# Patient Record
Sex: Male | Born: 1965 | Race: Asian | Hispanic: No | Marital: Married | Smoking: Never smoker
Health system: Southern US, Community
[De-identification: ages and names within clinical notes are randomized; demographics above are authoritative.]

## PROBLEM LIST (undated history)

## (undated) DIAGNOSIS — K7030 Alcoholic cirrhosis of liver without ascites: Secondary | ICD-10-CM

## (undated) DIAGNOSIS — A159 Respiratory tuberculosis unspecified: Secondary | ICD-10-CM

## (undated) DIAGNOSIS — M109 Gout, unspecified: Secondary | ICD-10-CM

## (undated) HISTORY — PX: JOINT REPLACEMENT: SHX530

---

## 2005-05-11 DIAGNOSIS — A159 Respiratory tuberculosis unspecified: Secondary | ICD-10-CM

## 2005-05-11 HISTORY — DX: Respiratory tuberculosis unspecified: A15.9

## 2005-07-29 ENCOUNTER — Ambulatory Visit (HOSPITAL_COMMUNITY): Admission: RE | Admit: 2005-07-29 | Discharge: 2005-07-29 | Payer: Self-pay | Admitting: Pulmonary Disease

## 2006-02-01 ENCOUNTER — Ambulatory Visit (HOSPITAL_COMMUNITY): Admission: RE | Admit: 2006-02-01 | Discharge: 2006-02-01 | Payer: Self-pay | Admitting: Pulmonary Disease

## 2008-05-11 HISTORY — PX: TOTAL HIP ARTHROPLASTY: SHX124

## 2009-01-21 ENCOUNTER — Inpatient Hospital Stay (HOSPITAL_COMMUNITY): Admission: RE | Admit: 2009-01-21 | Discharge: 2009-01-23 | Payer: Self-pay | Admitting: Orthopaedic Surgery

## 2009-02-25 ENCOUNTER — Inpatient Hospital Stay (HOSPITAL_COMMUNITY): Admission: RE | Admit: 2009-02-25 | Discharge: 2009-02-27 | Payer: Self-pay | Admitting: Orthopaedic Surgery

## 2010-08-14 LAB — COMPREHENSIVE METABOLIC PANEL
AST: 38 U/L — ABNORMAL HIGH (ref 0–37)
Albumin: 4.6 g/dL (ref 3.5–5.2)
BUN: 5 mg/dL — ABNORMAL LOW (ref 6–23)
CO2: 25 mEq/L (ref 19–32)
Chloride: 101 mEq/L (ref 96–112)
GFR calc Af Amer: >60 mL/min (ref >60)
Glucose, Bld: 102 mg/dL — ABNORMAL HIGH (ref 70–99)
Potassium: 4.3 mEq/L (ref 3.5–5.1)
Sodium: 134 mEq/L — ABNORMAL LOW (ref 135–145)
Total Bilirubin: 1.3 mg/dL — ABNORMAL HIGH (ref 0.3–1.2)
Total Protein: 8.6 g/dL — ABNORMAL HIGH (ref 6.0–8.3)

## 2010-08-14 LAB — CBC
HCT: 24.1 % — ABNORMAL LOW (ref 39.0–52.0)
Hemoglobin: 11.6 g/dL — ABNORMAL LOW (ref 13.0–17.0)
MCHC: 34.2 g/dL (ref 30.0–36.0)
MCHC: 33.8 g/dL (ref 30.0–36.0)
MCV: 89.6 fL (ref 78.0–100.0)
Platelets: 155 10*3/uL (ref 150–400)
Platelets: 180 10*3/uL (ref 150–400)
RBC: 2.69 MIL/uL — ABNORMAL LOW (ref 4.22–5.81)
RDW: 14.2 % (ref 11.5–15.5)
RDW: 14.3 % (ref 11.5–15.5)
WBC: 9 10*3/uL (ref 4.0–10.5)

## 2010-08-14 LAB — PROTIME-INR
INR: 1.19 (ref 0.00–1.49)
Prothrombin Time: 15 seconds (ref 11.6–15.2)

## 2010-08-14 LAB — DIFFERENTIAL
Basophils Absolute: 0 10*3/uL (ref 0.0–0.1)
Basophils Relative: 0 % (ref 0–1)
Eosinophils Absolute: 0.1 10*3/uL (ref 0.0–0.7)
Lymphocytes Relative: 27 % (ref 12–46)
Lymphs Abs: 2.2 10*3/uL (ref 0.7–4.0)
Monocytes Absolute: 0.6 10*3/uL (ref 0.1–1.0)

## 2010-08-14 LAB — BASIC METABOLIC PANEL
CO2: 28 mEq/L (ref 19–32)
Calcium: 8.9 mg/dL (ref 8.4–10.5)
Chloride: 102 mEq/L (ref 96–112)
GFR calc Af Amer: >60 mL/min (ref >60)
Glucose, Bld: 119 mg/dL — ABNORMAL HIGH (ref 70–99)
Potassium: 4.1 mEq/L (ref 3.5–5.1)

## 2010-08-15 LAB — COMPREHENSIVE METABOLIC PANEL
AST: 25 U/L (ref 0–37)
CO2: 29 mEq/L (ref 19–32)
Calcium: 9.1 mg/dL (ref 8.4–10.5)
Creatinine, Ser: 0.64 mg/dL (ref 0.4–1.5)
GFR calc Af Amer: >60 mL/min (ref >60)
GFR calc non Af Amer: >60 mL/min (ref >60)
Sodium: 136 mEq/L (ref 135–145)
Total Protein: 6.3 g/dL (ref 6.0–8.3)

## 2010-08-15 LAB — BASIC METABOLIC PANEL
CO2: 26 mEq/L (ref 19–32)
Chloride: 97 mEq/L (ref 96–112)
Creatinine, Ser: 0.72 mg/dL (ref 0.4–1.5)
GFR calc Af Amer: >60 mL/min (ref >60)
GFR calc Af Amer: >60 mL/min (ref >60)
GFR calc non Af Amer: >60 mL/min (ref >60)

## 2010-08-15 LAB — CBC
HCT: 36.2 % — ABNORMAL LOW (ref 39.0–52.0)
Hemoglobin: 12.4 g/dL — ABNORMAL LOW (ref 13.0–17.0)
Hemoglobin: 9.1 g/dL — ABNORMAL LOW (ref 13.0–17.0)
MCHC: 34.6 g/dL (ref 30.0–36.0)
MCV: 87.6 fL (ref 78.0–100.0)
MCV: 88.8 fL (ref 78.0–100.0)
MCV: 89.2 fL (ref 78.0–100.0)
Platelets: 103 10*3/uL — ABNORMAL LOW (ref 150–400)
Platelets: 135 10*3/uL — ABNORMAL LOW (ref 150–400)
RDW: 13.1 % (ref 11.5–15.5)
RDW: 13.3 % (ref 11.5–15.5)
RDW: 13.4 % (ref 11.5–15.5)

## 2010-08-15 LAB — HEPATIC FUNCTION PANEL
ALT: 14 U/L (ref 0–53)
AST: 30 U/L (ref 0–37)
Albumin: 4 g/dL (ref 3.5–5.2)
Total Bilirubin: 1.5 mg/dL — ABNORMAL HIGH (ref 0.3–1.2)
Total Protein: 7.7 g/dL (ref 6.0–8.3)

## 2010-08-15 LAB — PROTIME-INR
Prothrombin Time: 15.3 seconds — ABNORMAL HIGH (ref 11.6–15.2)
Prothrombin Time: 16.4 seconds — ABNORMAL HIGH (ref 11.6–15.2)

## 2011-04-07 ENCOUNTER — Other Ambulatory Visit (HOSPITAL_COMMUNITY): Payer: Self-pay | Admitting: Orthopaedic Surgery

## 2011-04-07 DIAGNOSIS — Z96643 Presence of artificial hip joint, bilateral: Secondary | ICD-10-CM

## 2011-04-14 ENCOUNTER — Encounter (HOSPITAL_COMMUNITY): Payer: Self-pay

## 2011-04-14 ENCOUNTER — Ambulatory Visit (HOSPITAL_COMMUNITY): Payer: Self-pay

## 2012-12-21 ENCOUNTER — Other Ambulatory Visit: Payer: Self-pay | Admitting: Orthopaedic Surgery

## 2012-12-21 DIAGNOSIS — M25552 Pain in left hip: Secondary | ICD-10-CM

## 2012-12-22 ENCOUNTER — Other Ambulatory Visit: Payer: Self-pay

## 2012-12-26 ENCOUNTER — Ambulatory Visit
Admission: RE | Admit: 2012-12-26 | Discharge: 2012-12-26 | Disposition: A | Discharge status: Home / Self Care | Payer: BC Managed Care – PPO | Source: Ambulatory Visit | Attending: Orthopaedic Surgery | Admitting: Orthopaedic Surgery

## 2012-12-26 DIAGNOSIS — M25552 Pain in left hip: Secondary | ICD-10-CM

## 2013-04-03 ENCOUNTER — Other Ambulatory Visit (HOSPITAL_COMMUNITY): Payer: Self-pay | Admitting: Orthopaedic Surgery

## 2013-04-04 ENCOUNTER — Encounter (HOSPITAL_COMMUNITY): Payer: Self-pay | Admitting: Pharmacy Technician

## 2013-04-07 ENCOUNTER — Encounter (HOSPITAL_COMMUNITY)
Admission: RE | Admit: 2013-04-07 | Discharge: 2013-04-07 | Disposition: A | Discharge status: Home / Self Care | Payer: BC Managed Care – PPO | Source: Ambulatory Visit | Attending: Orthopaedic Surgery | Admitting: Orthopaedic Surgery

## 2013-04-07 ENCOUNTER — Encounter (HOSPITAL_COMMUNITY): Payer: Self-pay

## 2013-04-07 ENCOUNTER — Ambulatory Visit (HOSPITAL_COMMUNITY)
Admission: RE | Admit: 2013-04-07 | Discharge: 2013-04-07 | Disposition: A | Discharge status: Home / Self Care | Payer: BC Managed Care – PPO | Source: Ambulatory Visit | Attending: Orthopaedic Surgery | Admitting: Orthopaedic Surgery

## 2013-04-07 DIAGNOSIS — Z01818 Encounter for other preprocedural examination: Secondary | ICD-10-CM | POA: Insufficient documentation

## 2013-04-07 DIAGNOSIS — Z01810 Encounter for preprocedural cardiovascular examination: Secondary | ICD-10-CM | POA: Insufficient documentation

## 2013-04-07 DIAGNOSIS — Z01812 Encounter for preprocedural laboratory examination: Secondary | ICD-10-CM | POA: Insufficient documentation

## 2013-04-07 HISTORY — DX: Alcoholic cirrhosis of liver without ascites: K70.30

## 2013-04-07 HISTORY — DX: Respiratory tuberculosis unspecified: A15.9

## 2013-04-07 LAB — URINALYSIS, ROUTINE W REFLEX MICROSCOPIC
Nitrite: NEGATIVE
Protein, ur: NEGATIVE mg/dL
Urobilinogen, UA: 1 mg/dL (ref 0.0–1.0)

## 2013-04-07 LAB — CBC
HCT: 42.3 % (ref 39.0–52.0)
Hemoglobin: 14.1 g/dL (ref 13.0–17.0)
MCH: 30.7 pg (ref 26.0–34.0)
MCHC: 33.3 g/dL (ref 30.0–36.0)
RDW: 15.8 % — ABNORMAL HIGH (ref 11.5–15.5)

## 2013-04-07 LAB — COMPREHENSIVE METABOLIC PANEL
BUN: 5 mg/dL — ABNORMAL LOW (ref 6–23)
Calcium: 9.3 mg/dL (ref 8.4–10.5)
GFR calc Af Amer: >90 mL/min (ref >90)
Glucose, Bld: 103 mg/dL — ABNORMAL HIGH (ref 70–99)
Sodium: 134 mEq/L — ABNORMAL LOW (ref 135–145)
Total Protein: 7.8 g/dL (ref 6.0–8.3)

## 2013-04-07 LAB — TYPE AND SCREEN
ABO/RH(D): O POS
Antibody Screen: NEGATIVE

## 2013-04-07 LAB — PROTIME-INR: Prothrombin Time: 13.5 seconds (ref 11.6–15.2)

## 2013-04-07 LAB — URINE MICROSCOPIC-ADD ON

## 2013-04-07 LAB — SURGICAL PCR SCREEN: Staphylococcus aureus: NEGATIVE

## 2013-04-07 NOTE — Progress Notes (Signed)
Pt denies SOB, chest pain, and being under the care of a cardiologist. Pt denies having a chest x ray and EKG within the last year in addition to having any cardiac diagnostic studies. Pt chart forwarded to anesthesia for review of chest x ray and abnormal labs ( AST 84 with history of cirrhosis).

## 2013-04-07 NOTE — Pre-Procedure Instructions (Signed)
Jon Lopez  04/07/2013   Your procedure is scheduled on:  Friday, April 14, 2013  Report to [facility] Short Stay (use Main Entrance "A'') at 7:30 AM.  Call this number if you have problems the morning of surgery: [phone]   Remember:   Do not eat food or drink liquids after midnight.   Take these medicines the morning of surgery with A SIP OF WATER: NONE Stop taking Aspirin vitamins and herbal medications. Do not take any NSAIDs ie: Ibuprofen, Advil, Naproxen or any medication containing Aspirin.  Do not wear jewelry, make-up or nail polish.  Do not wear lotions, powders, or perfumes. You may wear deodorant.  Do not shave 48 hours prior to surgery. Men may shave face and neck.  Do not bring valuables to the hospital.  [facility] is not responsible for any belongings or valuables.               Contacts, dentures or bridgework may not be worn into surgery.  Leave suitcase in the car. After surgery it may be brought to your room.  For patients admitted to the hospital, discharge time is determined by your treatment team.               Patients discharged the day of surgery will not be allowed to drive home.  Name and phone number of your driver:  Special Instructions: Shower using CHG 2 nights before surgery and the night before surgery.  If you shower the day of surgery use CHG.  Use special wash - you have one bottle of CHG for all showers.  You should use approximately 1/3 of the bottle for each shower.   Please read over the following fact sheets that you were given: Pain Booklet, Coughing and Deep Breathing, Blood Transfusion Information, MRSA Information and Surgical Site Infection Prevention

## 2013-04-10 NOTE — Progress Notes (Addendum)
Anesthesia Chart Review:  Patient is a 47 year old male scheduled for revision of left THA on 04/14/13 by Dr. Ophelia Charter.  History includes non-smoker, alcoholic cirrhosis of the liver with still occasional ETOH intake, TB s/p treatment in 2007, right THA '10. PCP is listed as Dr. Doreen Beam. He has seen GI Dr. Karilyn Cota in [place] in the past, but > 3 years ago.  EKG on 04/07/13 showed NSR, right atrial enlargement, non-specific ST/T wave abnormality. Non-specific changes are new since 01/16/09.  CXR report on 04/07/13 showed: The lungs are adequately inflated. There is no focal infiltrate. Tiny densities within the right lung measuring 1-2 mm in diameter most compatible with the known previous granulomatous infection. These appear stable. The cardiac silhouette is normal in size. The mediastinum is normal in width. There is no pleural effusion or pneumothorax. The observed portions of the bony thorax exhibit no acute abnormalities. There is curvature of the thoracolumbar junction with the convexity towards the right which appears stable. IMPRESSION: There is no evidence of pneumonia nor CHF nor other acute cardiopulmonary abnormality. There are no findings to suggest reactivation of tuberculosis.  Preoperative labs noted.  AST is elevated at 84.  ALT normal at 42, total bilirubin elevated at 2.0.  PLT count 121K.  PT/PTT WNL. Currently, there are no recent comparison labs. (In 02/2009, total bilirubin was 1.3, AST 38, ALT 25, ALK PHOS 131.)  I spoke with patient via telephone. He has not seen Dr. Sherril Croon in > 2 years.  He denies chest pain, SOB, edema, known ascites.  He reports he previously tested negative for hepatitis B and C, but I do not have these records.  Chart reviewed with anesthesiologist Dr. Jean Rosenthal who recommended further discuss with Dr. Sherril Croon in terms of follow-up and potentially more specifics about his cirrhosis history.  Dr. Sherril Croon reports patient has not been seen in nearly three years, so further input  would really need to be in conjunction with a formal visit. He will will review results from patient's PAT visit on 03/07/13. I have notified Elnita Maxwell at Dr. Ophelia Charter' office.  I also spoke with Mr. Burak.  Patient will contact Dr. Sherril Croon about scheduling a preoperative visit this week to see if he is felt acceptable risk for this procedure and to discuss future cirrhosis follow-up.   [facility]/Anesthesiology Phone [phone] 04/10/2013 2:05 PM  Addendum: 04/11/2013 3:30 PM I received preoperative clearance note from Dr. Sherril Croon today.  He has cleared patient with moderate risk.  He discussed risk of increased risk of hepatotoxicity with anesthesia. He is treating patient's hypokalemia.  He did recommend post-operative LFTs. He also recommended a post-operative ammonia level if there was any changes in patient's mental status. He plans to refer patient back to gastroenterology for consideration of further testing following recovery from hip surgery. Cheryl at Dr. Ophelia Charter office updated as well. If no acute changes then I would anticipate that patient could proceed as planned. Definite anesthesia plan to be discussed after anesthesiologist evaluation on the day of surgery.

## 2013-04-12 NOTE — H&P (Signed)
TOTAL HIP REVISION ADMISSION H&P  Patient is admitted for left revision total hip arthroplasty.  Subjective:  Chief Complaint: left hip pain  HPI: Jon Lopez, 47 y.o. male, has a history of pain and functional disability in the left hip due to painful metal on metal total hip replacement. and patient has failed non-surgical conservative treatments for greater than 12 weeks to include NSAID's and/or analgesics and activity modification. The indications for the revision total hip arthroplasty are elevated metal ion levels including chromium and cobalt..  Onset of symptoms was gradual starting 4 years ago with gradually worsening course since that time.  Prior procedures on the left hip include total hip replacement using Depuy metal on metal components.  Patient currently rates pain in the left hip at 6 out of 10 with activity.  There is worsening of pain with activity and weight bearing and pain that interfers with activities of daily living. Patient has no evidence of loosening or resorption  by imaging studies.  This condition presents safety issues increasing the risk of falls.  This patient has had ultrasound showing no significant fluid collection of the hip.  he has elevated cobalt of 80.5 with normal being less than 1.59mcg/Liter  and chromium level of 17.5 with the normal being 1.2 mcg/Liter .  There is no current active infection.  There are no active problems to display for this patient.  Past Medical History  Diagnosis Date  . Tuberculosis   . Liver cirrhosis, alcoholic     Hx: of    Past Surgical History  Procedure Laterality Date  . Joint replacement      B/L hip replacement    No prescriptions prior to admission   No Known Allergies  History  Substance Use Topics  . Smoking status: Never Smoker   . Smokeless tobacco: Never Used  . Alcohol Use: Yes    No family history on file.    Review of Systems  Musculoskeletal:       Left hip pain  All other systems  reviewed and are negative.    Objective:  Physical Exam  Constitutional: He is oriented to person, place, and time. He appears well-developed and well-nourished.  HENT:  Head: Normocephalic and atraumatic.  Eyes: EOM are normal. Pupils are equal, round, and reactive to light.  Neck: Normal range of motion.  Cardiovascular: Normal rate and intact distal pulses.   Respiratory: Effort normal.  GI: Soft.  Musculoskeletal:  Well healed surgical scar left hip.  No edema or erythema around hip or buttock.  Painful ROM left hip. Leg lengths are equal. Audible and palpable clunk when he walks and extends hip   Neurological: He is alert and oriented to person, place, and time.  Skin: Skin is warm and dry.  Psychiatric: He has a normal mood and affect.    Vital signs in last 24 hours:     Labs:   There is no height or weight on file to calculate BMI.  Imaging Review:  Plain radiographs demonstrates no loosening or resorption of the left hip(s). The bone quality appears to be adequate for age and reported activity level. There is no heterotopic ossification of the left hip.  Assessment/Plan:  Painful  left hip(s) with failed previous metal on metal Depuy arthroplasty. Elevated Chromium and cobalt ion levels.   Revision total hip arthroplasty is deemed medically necessary. The treatment options including medical management, injection therapy, arthroscopy and arthroplasty were discussed at length. The risks and benefits of  total hip arthroplasty were presented and reviewed. The risks due to aseptic loosening, infection, stiffness, dislocation/subluxation,  thromboembolic complications and other imponderables were discussed.  The patient acknowledged the explanation, agreed to proceed with the plan and consent was signed. Patient is being admitted for inpatient treatment for surgery, pain control, PT, OT, prophylactic antibiotics, VTE prophylaxis, progressive ambulation and ADL's and  discharge planning. The patient is planning to be discharged home with home health services

## 2013-04-13 MED ORDER — CEFAZOLIN SODIUM-DEXTROSE 2-3 GM-% IV SOLR
2.0000 g | INTRAVENOUS | Status: AC
  Administered 2013-04-14: 2 g via INTRAVENOUS
  Filled 2013-04-13: qty 50

## 2013-04-14 ENCOUNTER — Inpatient Hospital Stay (HOSPITAL_COMMUNITY)
Admission: RE | Admit: 2013-04-14 | Discharge: 2013-04-16 | DRG: 468 | Disposition: A | Discharge status: Home Health | Payer: BC Managed Care – PPO | Source: Ambulatory Visit | Attending: Orthopaedic Surgery | Admitting: Orthopaedic Surgery

## 2013-04-14 ENCOUNTER — Inpatient Hospital Stay (HOSPITAL_COMMUNITY): Payer: BC Managed Care – PPO | Admitting: Certified Registered"

## 2013-04-14 ENCOUNTER — Inpatient Hospital Stay (HOSPITAL_COMMUNITY): Payer: BC Managed Care – PPO

## 2013-04-14 ENCOUNTER — Encounter (HOSPITAL_COMMUNITY): Payer: Self-pay | Admitting: *Deleted

## 2013-04-14 ENCOUNTER — Encounter (HOSPITAL_COMMUNITY): Payer: BC Managed Care – PPO | Admitting: Vascular Surgery

## 2013-04-14 ENCOUNTER — Encounter (HOSPITAL_COMMUNITY)
Admission: RE | Disposition: A | Discharge status: Home Health | Payer: Self-pay | Source: Ambulatory Visit | Attending: Orthopaedic Surgery

## 2013-04-14 DIAGNOSIS — K7030 Alcoholic cirrhosis of liver without ascites: Secondary | ICD-10-CM | POA: Diagnosis present

## 2013-04-14 DIAGNOSIS — T8484XA Pain due to internal orthopedic prosthetic devices, implants and grafts, initial encounter: Secondary | ICD-10-CM

## 2013-04-14 DIAGNOSIS — F1020 Alcohol dependence, uncomplicated: Secondary | ICD-10-CM | POA: Diagnosis present

## 2013-04-14 DIAGNOSIS — Y831 Surgical operation with implant of artificial internal device as the cause of abnormal reaction of the patient, or of later complication, without mention of misadventure at the time of the procedure: Secondary | ICD-10-CM | POA: Diagnosis present

## 2013-04-14 DIAGNOSIS — Z8611 Personal history of tuberculosis: Secondary | ICD-10-CM

## 2013-04-14 DIAGNOSIS — Z96649 Presence of unspecified artificial hip joint: Secondary | ICD-10-CM

## 2013-04-14 DIAGNOSIS — Y92009 Unspecified place in unspecified non-institutional (private) residence as the place of occurrence of the external cause: Secondary | ICD-10-CM

## 2013-04-14 DIAGNOSIS — T8489XA Other specified complication of internal orthopedic prosthetic devices, implants and grafts, initial encounter: Principal | ICD-10-CM | POA: Diagnosis present

## 2013-04-14 DIAGNOSIS — Z23 Encounter for immunization: Secondary | ICD-10-CM

## 2013-04-14 HISTORY — PX: REVISION TOTAL HIP ARTHROPLASTY: SHX766

## 2013-04-14 HISTORY — DX: Gout, unspecified: M10.9

## 2013-04-14 HISTORY — PX: TOTAL HIP REVISION: SHX763

## 2013-04-14 SURGERY — TOTAL HIP REVISION
Anesthesia: General | Site: Hip | Laterality: Left

## 2013-04-14 MED ORDER — KETOROLAC TROMETHAMINE 30 MG/ML IJ SOLN
INTRAMUSCULAR | Status: AC
  Administered 2013-04-14: 15 mg
  Filled 2013-04-14: qty 1

## 2013-04-14 MED ORDER — PHENOL 1.4 % MT LIQD: 1.0000 | OROMUCOSAL | Status: DC | PRN

## 2013-04-14 MED ORDER — 0.9 % SODIUM CHLORIDE (POUR BTL) OPTIME
TOPICAL | Status: DC | PRN
  Administered 2013-04-14: 1000 mL

## 2013-04-14 MED ORDER — BUPIVACAINE HCL (PF) 0.25 % IJ SOLN
INTRAMUSCULAR | Status: AC
  Filled 2013-04-14: qty 30

## 2013-04-14 MED ORDER — PROMETHAZINE HCL 25 MG/ML IJ SOLN: 6.2500 mg | INTRAMUSCULAR | Status: DC | PRN

## 2013-04-14 MED ORDER — ROCURONIUM BROMIDE 100 MG/10ML IV SOLN
INTRAVENOUS | Status: DC | PRN
  Administered 2013-04-14: 50 mg via INTRAVENOUS

## 2013-04-14 MED ORDER — ASPIRIN EC 325 MG PO TBEC: 325.0000 mg | DELAYED_RELEASE_TABLET | Freq: Every day | ORAL | Status: AC

## 2013-04-14 MED ORDER — IBUPROFEN 800 MG PO TABS
800.0000 mg | ORAL_TABLET | Freq: Four times a day (QID) | ORAL | Status: DC | PRN
  Filled 2013-04-14: qty 1

## 2013-04-14 MED ORDER — OXYCODONE HCL 5 MG PO TABS
ORAL_TABLET | ORAL | Status: AC
  Filled 2013-04-14: qty 1

## 2013-04-14 MED ORDER — METHOCARBAMOL 500 MG PO TABS: 500.0000 mg | ORAL_TABLET | Freq: Four times a day (QID) | ORAL | Status: AC | PRN

## 2013-04-14 MED ORDER — INFLUENZA VAC SPLIT QUAD 0.5 ML IM SUSP
0.5000 mL | INTRAMUSCULAR | Status: AC
  Administered 2013-04-16: 0.5 mL via INTRAMUSCULAR
  Filled 2013-04-14: qty 0.5

## 2013-04-14 MED ORDER — OXYCODONE HCL 5 MG PO TABS
5.0000 mg | ORAL_TABLET | ORAL | Status: DC | PRN
  Administered 2013-04-14 – 2013-04-15 (×2): 10 mg via ORAL
  Administered 2013-04-15: 5 mg via ORAL
  Administered 2013-04-15 – 2013-04-16 (×7): 10 mg via ORAL
  Filled 2013-04-14 (×10): qty 2

## 2013-04-14 MED ORDER — OXYCODONE HCL 5 MG PO TABS
5.0000 mg | ORAL_TABLET | Freq: Once | ORAL | Status: AC | PRN
  Administered 2013-04-14: 5 mg via ORAL

## 2013-04-14 MED ORDER — ASPIRIN EC 325 MG PO TBEC
325.0000 mg | DELAYED_RELEASE_TABLET | Freq: Every day | ORAL | Status: DC
  Administered 2013-04-15 – 2013-04-16 (×2): 325 mg via ORAL
  Filled 2013-04-14 (×3): qty 1

## 2013-04-14 MED ORDER — BUPIVACAINE HCL (PF) 0.25 % IJ SOLN
INTRAMUSCULAR | Status: DC | PRN
  Administered 2013-04-14: 20 mL

## 2013-04-14 MED ORDER — OXYCODONE-ACETAMINOPHEN 5-325 MG PO TABS: 1.0000 | ORAL_TABLET | ORAL | Status: AC | PRN

## 2013-04-14 MED ORDER — KETOROLAC TROMETHAMINE 15 MG/ML IJ SOLN: 15.0000 mg | Freq: Once | INTRAMUSCULAR | Status: AC

## 2013-04-14 MED ORDER — MORPHINE SULFATE 2 MG/ML IJ SOLN
2.0000 mg | INTRAMUSCULAR | Status: DC | PRN
  Administered 2013-04-14 – 2013-04-15 (×3): 2 mg via INTRAVENOUS
  Filled 2013-04-14 (×3): qty 1

## 2013-04-14 MED ORDER — GLYCOPYRROLATE 0.2 MG/ML IJ SOLN
INTRAMUSCULAR | Status: DC | PRN
  Administered 2013-04-14: 0.4 mg via INTRAVENOUS

## 2013-04-14 MED ORDER — ONDANSETRON HCL 4 MG/2ML IJ SOLN
INTRAMUSCULAR | Status: DC | PRN
  Administered 2013-04-14: 4 mg via INTRAVENOUS

## 2013-04-14 MED ORDER — ONDANSETRON HCL 4 MG PO TABS: 4.0000 mg | ORAL_TABLET | Freq: Four times a day (QID) | ORAL | Status: DC | PRN

## 2013-04-14 MED ORDER — MENTHOL 3 MG MT LOZG
1.0000 | LOZENGE | OROMUCOSAL | Status: DC | PRN
  Filled 2013-04-14: qty 9

## 2013-04-14 MED ORDER — HYDROMORPHONE HCL PF 1 MG/ML IJ SOLN
INTRAMUSCULAR | Status: AC
  Administered 2013-04-14: 0.5 mg via INTRAVENOUS
  Filled 2013-04-14: qty 1

## 2013-04-14 MED ORDER — LACTATED RINGERS IV SOLN
Freq: Once | INTRAVENOUS | Status: AC
  Administered 2013-04-14 (×2): via INTRAVENOUS

## 2013-04-14 MED ORDER — KCL IN DEXTROSE-NACL 20-5-0.45 MEQ/L-%-% IV SOLN
INTRAVENOUS | Status: DC
  Administered 2013-04-14: 16:00:00 via INTRAVENOUS
  Administered 2013-04-15: 30 mL/h via INTRAVENOUS
  Filled 2013-04-14 (×5): qty 1000

## 2013-04-14 MED ORDER — ACETAMINOPHEN 650 MG RE SUPP: 650.0000 mg | Freq: Four times a day (QID) | RECTAL | Status: DC | PRN

## 2013-04-14 MED ORDER — ACETAMINOPHEN 325 MG PO TABS
650.0000 mg | ORAL_TABLET | Freq: Four times a day (QID) | ORAL | Status: DC | PRN
  Administered 2013-04-15: 650 mg via ORAL
  Filled 2013-04-14: qty 2

## 2013-04-14 MED ORDER — METOCLOPRAMIDE HCL 5 MG/ML IJ SOLN: 5.0000 mg | Freq: Three times a day (TID) | INTRAMUSCULAR | Status: DC | PRN

## 2013-04-14 MED ORDER — PNEUMOCOCCAL VAC POLYVALENT 25 MCG/0.5ML IJ INJ
0.5000 mL | INJECTION | INTRAMUSCULAR | Status: AC
  Administered 2013-04-16: 0.5 mL via INTRAMUSCULAR
  Filled 2013-04-14: qty 0.5

## 2013-04-14 MED ORDER — OXYCODONE HCL 5 MG/5ML PO SOLN: 5.0000 mg | Freq: Once | ORAL | Status: AC | PRN

## 2013-04-14 MED ORDER — PHENYLEPHRINE HCL 10 MG/ML IJ SOLN
INTRAMUSCULAR | Status: DC | PRN
  Administered 2013-04-14 (×3): 80 ug via INTRAVENOUS

## 2013-04-14 MED ORDER — FENTANYL CITRATE 0.05 MG/ML IJ SOLN
INTRAMUSCULAR | Status: DC | PRN
  Administered 2013-04-14: 50 ug via INTRAVENOUS
  Administered 2013-04-14 (×2): 25 ug via INTRAVENOUS
  Administered 2013-04-14: 150 ug via INTRAVENOUS

## 2013-04-14 MED ORDER — MIDAZOLAM HCL 5 MG/5ML IJ SOLN
INTRAMUSCULAR | Status: DC | PRN
  Administered 2013-04-14: 2 mg via INTRAVENOUS

## 2013-04-14 MED ORDER — DIPHENHYDRAMINE HCL 25 MG PO CAPS
25.0000 mg | ORAL_CAPSULE | Freq: Four times a day (QID) | ORAL | Status: DC | PRN
  Administered 2013-04-14 – 2013-04-15 (×3): 25 mg via ORAL
  Filled 2013-04-14 (×3): qty 1

## 2013-04-14 MED ORDER — HYDROMORPHONE HCL PF 1 MG/ML IJ SOLN
INTRAMUSCULAR | Status: AC
  Filled 2013-04-14: qty 1

## 2013-04-14 MED ORDER — HYDROMORPHONE HCL PF 1 MG/ML IJ SOLN
0.2500 mg | INTRAMUSCULAR | Status: DC | PRN
  Administered 2013-04-14 (×4): 0.5 mg via INTRAVENOUS

## 2013-04-14 MED ORDER — NEOSTIGMINE METHYLSULFATE 1 MG/ML IJ SOLN
INTRAMUSCULAR | Status: DC | PRN
  Administered 2013-04-14: 3 mg via INTRAVENOUS

## 2013-04-14 MED ORDER — LIDOCAINE HCL (CARDIAC) 20 MG/ML IV SOLN
INTRAVENOUS | Status: DC | PRN
  Administered 2013-04-14: 75 mg via INTRAVENOUS

## 2013-04-14 MED ORDER — PROPOFOL 10 MG/ML IV BOLUS
INTRAVENOUS | Status: DC | PRN
  Administered 2013-04-14: 180 mg via INTRAVENOUS

## 2013-04-14 MED ORDER — CHLORHEXIDINE GLUCONATE 4 % EX LIQD: 60.0000 mL | Freq: Once | CUTANEOUS | Status: DC

## 2013-04-14 MED ORDER — ONDANSETRON HCL 4 MG/2ML IJ SOLN: 4.0000 mg | Freq: Four times a day (QID) | INTRAMUSCULAR | Status: DC | PRN

## 2013-04-14 MED ORDER — METOCLOPRAMIDE HCL 10 MG PO TABS: 5.0000 mg | ORAL_TABLET | Freq: Three times a day (TID) | ORAL | Status: DC | PRN

## 2013-04-14 SURGICAL SUPPLY — 68 items
APL SKNCLS STERI-STRIP NONHPOA (GAUZE/BANDAGES/DRESSINGS) ×1
ARTICULEZE HEAD (Hips) ×2 IMPLANT
BENZOIN TINCTURE PRP APPL 2/3 (GAUZE/BANDAGES/DRESSINGS) ×2 IMPLANT
BLADE SURG 10 STRL SS (BLADE) ×3 IMPLANT
BLADE SURG ROTATE 9660 (MISCELLANEOUS) IMPLANT
BRUSH FEMORAL CANAL (MISCELLANEOUS) IMPLANT
CLOTH BEACON ORANGE TIMEOUT ST (SAFETY) ×2 IMPLANT
COVER BACK TABLE 24X17X13 BIG (DRAPES) ×2 IMPLANT
COVER SURGICAL LIGHT HANDLE (MISCELLANEOUS) ×2 IMPLANT
DRAPE C-ARM 42X72 X-RAY (DRAPES) IMPLANT
DRAPE INCISE IOBAN 66X45 STRL (DRAPES) IMPLANT
DRAPE ORTHO SPLIT 77X108 STRL (DRAPES) ×4
DRAPE SURG ORHT 6 SPLT 77X108 (DRAPES) ×2 IMPLANT
DRAPE U-SHAPE 47X51 STRL (DRAPES) ×2 IMPLANT
DRSG PAD ABDOMINAL 8X10 ST (GAUZE/BANDAGES/DRESSINGS) ×4 IMPLANT
DURAPREP 26ML APPLICATOR (WOUND CARE) ×2 IMPLANT
ELECT BLADE 4.0 EZ CLEAN MEGAD (MISCELLANEOUS) ×2
ELECT CAUTERY BLADE 6.4 (BLADE) ×2 IMPLANT
ELECT REM PT RETURN 9FT ADLT (ELECTROSURGICAL) ×2
ELECTRODE BLDE 4.0 EZ CLN MEGD (MISCELLANEOUS) IMPLANT
ELECTRODE REM PT RTRN 9FT ADLT (ELECTROSURGICAL) ×1 IMPLANT
EVACUATOR 1/8 PVC DRAIN (DRAIN) IMPLANT
FACESHIELD LNG OPTICON STERILE (SAFETY) ×4 IMPLANT
GAUZE XEROFORM 5X9 LF (GAUZE/BANDAGES/DRESSINGS) ×2 IMPLANT
GLOVE BIOGEL PI IND STRL 7.5 (GLOVE) ×1 IMPLANT
GLOVE BIOGEL PI IND STRL 8 (GLOVE) ×1 IMPLANT
GLOVE BIOGEL PI INDICATOR 7.5 (GLOVE) ×1
GLOVE BIOGEL PI INDICATOR 8 (GLOVE) ×1
GLOVE ECLIPSE 7.0 STRL STRAW (GLOVE) ×2 IMPLANT
GLOVE ORTHO TXT STRL SZ7.5 (GLOVE) ×2 IMPLANT
GOWN PREVENTION PLUS LG XLONG (DISPOSABLE) IMPLANT
GOWN PREVENTION PLUS XLARGE (GOWN DISPOSABLE) ×2 IMPLANT
GOWN STRL NON-REIN LRG LVL3 (GOWN DISPOSABLE) ×2 IMPLANT
HANDPIECE INTERPULSE COAX TIP (DISPOSABLE)
HEAD ARTICULEZE (Hips) IMPLANT
IMMOBILIZER KNEE 20 (SOFTGOODS)
IMMOBILIZER KNEE 20 THIGH 36 (SOFTGOODS) IMPLANT
IMMOBILIZER KNEE 22 UNIV (SOFTGOODS) IMPLANT
IMMOBILIZER KNEE 24 THIGH 36 (MISCELLANEOUS) IMPLANT
IMMOBILIZER KNEE 24 UNIV (MISCELLANEOUS)
KIT BASIN OR (CUSTOM PROCEDURE TRAY) ×2 IMPLANT
KIT ROOM TURNOVER OR (KITS) ×2 IMPLANT
LINER MARATHON 4MM 10DEG 36X52 (Hips) ×1 IMPLANT
MANIFOLD NEPTUNE II (INSTRUMENTS) ×2 IMPLANT
NDL 1/2 CIR MAYO (NEEDLE) ×1 IMPLANT
NDL HYPO 25GX1X1/2 BEV (NEEDLE) ×1 IMPLANT
NEEDLE 1/2 CIR MAYO (NEEDLE) ×2 IMPLANT
NEEDLE HYPO 25GX1X1/2 BEV (NEEDLE) ×2 IMPLANT
NS IRRIG 1000ML POUR BTL (IV SOLUTION) ×2 IMPLANT
PACK TOTAL JOINT (CUSTOM PROCEDURE TRAY) ×2 IMPLANT
PAD ARMBOARD 7.5X6 YLW CONV (MISCELLANEOUS) ×4 IMPLANT
REAMER ROD DEEP FLUTE 2.5X950 (INSTRUMENTS) IMPLANT
SET HNDPC FAN SPRY TIP SCT (DISPOSABLE) IMPLANT
SPONGE GAUZE 4X4 12PLY (GAUZE/BANDAGES/DRESSINGS) ×2 IMPLANT
SPONGE LAP 4X18 X RAY DECT (DISPOSABLE) ×4 IMPLANT
STAPLER VISISTAT 35W (STAPLE) ×2 IMPLANT
SUCTION FRAZIER TIP 10 FR DISP (SUCTIONS) ×2 IMPLANT
SUT ETHIBOND NAB CT1 #1 30IN (SUTURE) ×6 IMPLANT
SUT TICRON (SUTURE) ×4 IMPLANT
SUT VIC AB 2-0 CT1 27 (SUTURE) ×6
SUT VIC AB 2-0 CT1 TAPERPNT 27 (SUTURE) ×3 IMPLANT
SUT VICRYL 0 TIES 12 18 (SUTURE) ×2 IMPLANT
SYR CONTROL 10ML LL (SYRINGE) ×2 IMPLANT
TOWEL OR 17X24 6PK STRL BLUE (TOWEL DISPOSABLE) ×2 IMPLANT
TOWEL OR 17X26 10 PK STRL BLUE (TOWEL DISPOSABLE) ×2 IMPLANT
TOWER CARTRIDGE SMART MIX (DISPOSABLE) IMPLANT
TRAY FOLEY CATH 16FRSI W/METER (SET/KITS/TRAYS/PACK) IMPLANT
WATER STERILE IRR 1000ML POUR (IV SOLUTION) ×8 IMPLANT

## 2013-04-14 NOTE — Preoperative (Signed)
Beta Blockers   Reason not to administer Beta Blockers:Not Applicable 

## 2013-04-14 NOTE — Transfer of Care (Signed)
Immediate Anesthesia Transfer of Care Note  Patient: Jon Lopez  Procedure(s) Performed: Procedure(s) with comments: TOTAL HIP REVISION (Left) - Revision Left Total Hip Arthroplasty  Patient Location: PACU  Anesthesia Type:General  Level of Consciousness: awake, alert  and oriented  Airway & Oxygen Therapy: Patient Spontanous Breathing and Patient connected to nasal cannula oxygen  Post-op Assessment: Report given to PACU RN  Post vital signs: Reviewed and stable  Complications: No apparent anesthesia complications

## 2013-04-14 NOTE — Anesthesia Postprocedure Evaluation (Signed)
Anesthesia Post Note  Patient: Jon Lopez  Procedure(s) Performed: Procedure(s) (LRB): TOTAL HIP REVISION (Left)  Anesthesia type: general  Patient location: PACU  Post pain: Pain level controlled  Post assessment: Patient's Cardiovascular Status Stable  Last Vitals:  Filed Vitals:   04/14/13 1300  BP: 108/81  Pulse: 74  Temp:   Resp: 9    Post vital signs: Reviewed and stable  Level of consciousness: sedated  Complications: No apparent anesthesia complications

## 2013-04-14 NOTE — Anesthesia Preprocedure Evaluation (Signed)
Anesthesia Evaluation  Patient identified by MRN, date of birth, ID band Patient awake    Reviewed: Allergy & Precautions, H&P , NPO status , Patient's Chart, lab work & pertinent test results  History of Anesthesia Complications Negative for: history of anesthetic complications  Airway Mallampati: II TM Distance: >3 FB Neck ROM: Full    Dental  (+) Teeth Intact and Dental Advisory Given   Pulmonary neg pulmonary ROS,    Pulmonary exam normal       Cardiovascular negative cardio ROS      Neuro/Psych negative neurological ROS  negative psych ROS   GI/Hepatic negative GI ROS, (+) Cirrhosis -       ,   Endo/Other  negative endocrine ROS  Renal/GU negative Renal ROS     Musculoskeletal   Abdominal   Peds  Hematology negative hematology ROS (+)   Anesthesia Other Findings   Reproductive/Obstetrics                           Anesthesia Physical Anesthesia Plan  ASA: III  Anesthesia Plan: General   Post-op Pain Management:    Induction: Intravenous  Airway Management Planned: Oral ETT  Additional Equipment:   Intra-op Plan:   Post-operative Plan: Extubation in OR  Informed Consent: I have reviewed the patients History and Physical, chart, labs and discussed the procedure including the risks, benefits and alternatives for the proposed anesthesia with the patient or authorized representative who has indicated his/her understanding and acceptance.   Dental advisory given  Plan Discussed with: CRNA, Anesthesiologist and Surgeon  Anesthesia Plan Comments:         Anesthesia Quick Evaluation

## 2013-04-14 NOTE — Interval H&P Note (Signed)
History and Physical Interval Note:  04/14/2013 8:59 AM  Jon Lopez  has presented today for surgery, with the diagnosis of Painful Left Hip Metal on Metal Total Hip Arthroplasty  The various methods of treatment have been discussed with the patient and family. After consideration of risks, benefits and other options for treatment, the patient has consented to  Procedure(s) with comments: TOTAL HIP REVISION (Left) - Revision Left Total Hip Arthroplasty as a surgical intervention .  The patient's history has been reviewed, patient examined, no change in status, stable for surgery.  I have reviewed the patient's chart and labs.  Questions were answered to the patient's satisfaction.     YATES,MARK C

## 2013-04-14 NOTE — Progress Notes (Signed)
Orthopedic Tech Progress Note Patient Details:  Jon Lopez [birth year] [id number] Put on ohf on bed Ortho Devices Type of Ortho Device: Knee Immobilizer Ortho Device/Splint Interventions: Casandra Doffing 04/14/2013, 3:58 PM

## 2013-04-14 NOTE — Brief Op Note (Cosign Needed)
04/14/2013  12:32 PM  PATIENT:  Jon Lopez  47 y.o. male  PRE-OPERATIVE DIAGNOSIS:  Painful Left Hip Metal on Metal Total Hip Arthroplasty  POST-OPERATIVE DIAGNOSIS:  Painful Left Hip Metal on Metal Total Hip Arthroplasty  PROCEDURE:  Procedure(s) with comments: TOTAL HIP REVISION (Left) - Revision Left Total Hip Arthroplasty  SURGEON:  Surgeon(s) and Role:    * Eldred Manges, MD - Primary  PHYSICIAN ASSISTANT: Maud Deed [facility]  ASSISTANTS: none   ANESTHESIA:   general  EBL:  Total I/O In: 1500 [I.V.:1500] Out: 335 [Urine:135; Blood:200]  BLOOD ADMINISTERED:none  DRAINS: none   LOCAL MEDICATIONS USED:  MARCAINE     SPECIMEN:  Source of Specimen:  joint fluid and tissue.  explants from hip including ball and acetabular liner to pathology  DISPOSITION OF SPECIMEN:  microbiology and pathology  COUNTS:  YES  TOURNIQUET:  * No tourniquets in log *  DICTATION: .Note written in EPIC  Lopez OF CARE: Admit to inpatient   PATIENT DISPOSITION:  PACU - hemodynamically stable.   Delay start of Pharmacological VTE agent (>24hrs) due to surgical blood loss or risk of bleeding: no

## 2013-04-14 NOTE — Progress Notes (Signed)
Orthopedic Tech Progress Note Patient Details:  Jon Lopez [birth year] [id number]  Ortho Devices Type of Ortho Device: Knee Immobilizer Ortho Device/Splint Interventions: Ordered   [name] 04/14/2013, 3:28 PM

## 2013-04-14 NOTE — Anesthesia Procedure Notes (Signed)
Procedure Name: Intubation Date/Time: 04/14/2013 9:50 AM Performed by: Jerilee Hoh Pre-anesthesia Checklist: Patient identified, Emergency Drugs available, Patient being monitored and Suction available Patient Re-evaluated:Patient Re-evaluated prior to inductionOxygen Delivery Method: Circle system utilized Preoxygenation: Pre-oxygenation with 100% oxygen Intubation Type: IV induction Ventilation: Mask ventilation without difficulty Laryngoscope Size: Mac and 4 Grade View: Grade I Tube type: Oral Tube size: 7.5 mm Number of attempts: 1 Airway Equipment and Method: Stylet Placement Confirmation: ETT inserted through vocal cords under direct vision,  positive ETCO2 and breath sounds checked- equal and bilateral Secured at: 22 cm Tube secured with: Tape Dental Injury: Teeth and Oropharynx as per pre-operative assessment

## 2013-04-14 NOTE — Progress Notes (Signed)
UR completed 

## 2013-04-15 LAB — BASIC METABOLIC PANEL
CO2: 23 mEq/L (ref 19–32)
Calcium: 8.1 mg/dL — ABNORMAL LOW (ref 8.4–10.5)
Creatinine, Ser: 0.7 mg/dL (ref 0.50–1.35)
Glucose, Bld: 114 mg/dL — ABNORMAL HIGH (ref 70–99)
Sodium: 132 mEq/L — ABNORMAL LOW (ref 135–145)

## 2013-04-15 LAB — CBC
Hemoglobin: 11.1 g/dL — ABNORMAL LOW (ref 13.0–17.0)
MCH: 31.2 pg (ref 26.0–34.0)
MCV: 93.5 fL (ref 78.0–100.0)
RBC: 3.56 MIL/uL — ABNORMAL LOW (ref 4.22–5.81)
WBC: 6.9 10*3/uL (ref 4.0–10.5)

## 2013-04-15 MED ORDER — DOCUSATE SODIUM 100 MG PO CAPS: 100.0000 mg | ORAL_CAPSULE | Freq: Two times a day (BID) | ORAL | Status: DC | PRN

## 2013-04-15 MED ORDER — METHOCARBAMOL 500 MG PO TABS
500.0000 mg | ORAL_TABLET | Freq: Four times a day (QID) | ORAL | Status: DC | PRN
  Administered 2013-04-15 (×2): 500 mg via ORAL
  Filled 2013-04-15 (×2): qty 1

## 2013-04-15 MED ORDER — DOCUSATE SODIUM 100 MG PO CAPS
100.0000 mg | ORAL_CAPSULE | Freq: Two times a day (BID) | ORAL | Status: DC
  Administered 2013-04-15 – 2013-04-16 (×3): 100 mg via ORAL
  Filled 2013-04-15 (×4): qty 1

## 2013-04-15 NOTE — Op Note (Signed)
NAMESHANNEN, VERNON              ACCOUNT NO.:  [id number]  MEDICAL RECORD NO.:  [id number]  LOCATION:  5N01C                        FACILITY:  [facility]  PHYSICIAN:  Mark C. Ophelia Charter, M.D.    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  PROCEDURE:  Left hip synovectomy and revision of acetabular component. Conversion to metal on poly +5 neck, 36 ball, and 52-mm acetabular shell.  Polyethylene with 10 degree liner.  SURGEON:  Mark C. Ophelia Charter, M.D.  ASSISTANT:  Wende Neighbors, PA-C, medically necessary and present for the entire procedure.  ESTIMATED BLOOD LOSS:  200 mL.  ANESTHESIA:  GOT plus Marcaine skin local.  COMPLICATIONS:  None.  PROCEDURE IN DETAIL:  This patient has been worked up for painful synovitis of his hip with metal-on-metal with significantly elevated iron levels with chromium level of 17.5 and cobalt level at 80.5 mcg/L. He has had pain in his left hip and has metal-on-metal hip in the opposite right hip _arthroplasty.  There was no evidence of radiographic loosening.  Informed consent was obtained for planned revision of his bearing surface.  After induction of general anesthesia, the patient was placed in lateral position.  Preoperative Ancef.  Time-out procedure was completed.  DuraPrep was used.  Usual axillary roll, total hip sheets, drapes, Impervious stockinette, sterile skin marker on the old posterior approach, incision and Betadine Steri-Drape x2 to seal the skin.  Old incision was opened.  The gluteus was split in the old incision.  Tensor fascia was divided.  Charnley retractor was placed.  Sciatic nerve was palpated.  Posterior capsule was opened.  There was synovitis  present with appearance of synovium in nodular form that appeared to be similar clusters of blueberries.  It is bluish gray to black and _clustered with nodularity and synovectomy was performed removing tissue, which was significant. Culture was obtained.  There was clear fluid, which was cloudy typical for metallosis.  Once the rim of the acetabulum was exposed, this was a 2 piece metal-on-metal, and suction cup was applied and gentle tapping was performed for about an hour using combination of bone tap osteotomes, large and small hammer, K wires various sizes  trying a different size Steinmann pins in the small hole between the metal liner and metal acetabular shell, which was unsuccessful trying to use the larger hammer, ___liner_______ would not spin, would not move.  Care was taken to make sure the entire rim was exposed and no soft tissue was holding it.  Suction cup was unable to remove it.  At this point, it was apparent that acetabular revision of the entire shell would be needed. Moreland osteotomes_  were __________ opened and attempts were made to go around the cup removing bone to break the ingrowth taking little amount of bone.  When it appeared that this had been fairly well done, attempts of pounding on the shell was performed.  The metal suction apparatus was used with a  tiny suction cup just in case and as the back of the shell was being pound to try to dislodge it.  The metal liner disengaged from the 2 piece and it popped out.  Shell was tested, was secure, and was unable to be moved.  The central hole was removed and large inserter was inserted.  It was attempted to break it loose, it was solid, not moving at this point with the shell secure after an hour and a half of work. The liner had not been damaged to side and polyethylene 10-degree liner was selected where 52 shell was inserted and tacked in place, tested, and was secured.  Acetabular shell was still attached  with a bony ingrowth despite going around the rim and attempt to dislodge it when initially the metal-on-metal liner cannot pop loose.  The acetabulum was still secure trial sizing for +5, which was the same as __ previously was inserted.  The hip was flexed to 90 degrees and internally rotated. _minimal _________ shuck.  There was good stability with internal rotation to 85 degrees, flexion past 90 degrees, and adduction. Quad was not tight.  X- ray was taken to confirm there was restoration and there had been no penetration into the acetabulum.  The acetabulum not been cracked in attempts to remove the ingrowth.  X-ray looked good.  Leg lengths were restored.  Hip was then dislocated.  The metal ball was popped on after cleaning the trunnion and the hip was reduced protecting the__ sciatic nerve and again stability was confirmed with identical findings.  Copious irrigation was placed for placement of the polyethylene liner as well as after reduction of the hip and then standard layered closure with nonabsorbable and tensor fascia, #1 Vicryl in gluteus maximus fascia, 2- 0 Vicryl subcutaneous tissue, subcuticular skin closure.  Tincture of benzoin, Steri-Strips application, postop dressing, and knee immobilizer.  The patient tolerated the procedure well, was transferred to recovery room in stable condition.  Instrument count and needle count was correct.     Mark C. Ophelia Charter, M.D.     MCY/MEDQ  D:  04/14/2013  T:  04/15/2013  Job:  [id number]

## 2013-04-15 NOTE — Progress Notes (Signed)
Patient ID: Jon Lopez, male   DOB: [birth year], 47 y.o.   MRN: [id number] Postoperative day 1 status post revision metal-on-metal total hip arthroplasty. Patient states that he feels well. Will have him work on therapy today anticipate discharge to home on Sunday. Orders written for [facility] twice a day

## 2013-04-15 NOTE — Plan of Care (Signed)
Problem: Consults Goal: Diagnosis- Total Joint Replacement Outcome: Completed/Met Date Met:  04/15/13 Revision Total Hip

## 2013-04-15 NOTE — Evaluation (Signed)
Occupational Therapy Evaluation Patient Details Name: Jon Lopez MRN: [id number] DOB: 1966/01/14 Today's Date: 04/15/2013 Time: 7829-5621 OT Time Calculation (min): 20 min  OT Assessment / Plan / Recommendation History of present illness Pt. 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 Pt independent with ADLs, PTA. Pt will benefit from acute OT to increase independence prior to d/c.     OT Assessment  Patient needs continued OT Services    Follow Up Recommendations  No OT follow up;Supervision/Assistance - 24 hour    Barriers to Discharge      Equipment Recommendations  Other (comment) ( long handled sponge, shoehorn, sockaid)    Recommendations for Other Services    Frequency  Min 2X/week    Precautions / Restrictions Precautions Precautions: Posterior Hip Precaution Booklet Issued: No Precaution Comments: Reviewed hip precautions Required Braces or Orthoses: Knee Immobilizer - Left Knee Immobilizer - Left: Other (comment) (until discontinued) Restrictions Weight Bearing Restrictions: Yes LLE Weight Bearing: Weight bearing as tolerated   Pertinent Vitals/Pain Pain 9+/10. Repositioned.     ADL  Grooming: Set up;Supervision/safety Where Assessed - Grooming: Supported sitting Upper Body Dressing: Set up;Supervision/safety Where Assessed - Upper Body Dressing: Supported sitting Lower Body Dressing: Minimal assistance Where Assessed - Lower Body Dressing: Supported sit to stand Toilet Transfer: Min Sports administrator Method: Sit to Barista: Comfort height toilet;Grab bars;Bedside commode (Min A-commode and Min guard-BSC) Tub/Shower Transfer Method: Not assessed Equipment Used: Gait belt;Rolling walker;Knee Immobilizer;Sock aid;Reacher;Long-handled sponge;Long-handled shoe horn Transfers/Ambulation Related to ADLs: Min guard for  ambulation-cues for feet not to get in front of walker and also for technique with turns. Min A/Min guard for transfers. ADL Comments: Educated on AE for LB ADLs. Pt practiced with reacher and sockaid donning/doffing left sock. Pt able to raise right leg to don/doff sock.  Spoke about tub transfer technques. Recommended using 3 in 1 over commode.    OT Diagnosis: Acute pain  OT Problem List: Decreased strength;Decreased activity tolerance;Impaired balance (sitting and/or standing);Decreased knowledge of use of DME or AE;Decreased knowledge of precautions;Pain OT Treatment Interventions: Self-care/ADL training;DME and/or AE instruction;Therapeutic activities;Patient/family education;Balance training   OT Goals(Current goals can be found in the care plan section) Acute Rehab OT Goals Patient Stated Goal: get out of here OT Goal Formulation: With patient Time For Goal Achievement: 04/22/13 Potential to Achieve Goals: Good ADL Goals Pt Will Perform Lower Body Bathing: with modified independence;with adaptive equipment;sit to/from stand Pt Will Perform Lower Body Dressing: with adaptive equipment;sit to/from stand;with modified independence Pt Will Transfer to Toilet: with modified independence;ambulating (3 in 1 over commode) Pt Will Perform Toileting - Clothing Manipulation and hygiene: with modified independence;sit to/from stand Pt Will Perform Tub/Shower Transfer: Tub transfer;with supervision;ambulating;rolling walker (tub equipment tbd) Additional ADL Goal #1: Pt will independently verbalize and demonstrate 3/3 hip precautions.  Additional ADL Goal #2: Pt will perform bed mobility at Mod I level as precursor for ADLs.  Visit Information  Last OT Received On: 04/15/13 Assistance Needed: +1 History of Present Illness: Pt. 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, posterior approach       Prior Functioning     Home Living Family/patient expects to be discharged to:: Private  residence Living Arrangements: Spouse/significant other;Children Available Help at Discharge: Available 24 hours/day Type of Home: Apartment Home Access: Stairs to enter [employer] of Steps: 1 Entrance  Stairs-Rails: None Home Layout: One level Home Equipment: Walker - 2 wheels;Bedside commode;Adaptive equipment;Shower Engineering geologist: Educational psychologist Comments: wife to see if she can locate equipment in the storage unit Prior Function Level of Independence: Independent Communication Communication: No difficulties         Vision/Perception     Cognition  Cognition Arousal/Alertness: Awake/alert Behavior During Therapy: WFL for tasks assessed/performed Overall Cognitive Status: Within Functional Limits for tasks assessed    Extremity/Trunk Assessment Upper Extremity Assessment Upper Extremity Assessment: Overall WFL for tasks assessed Lower Extremity Assessment Lower Extremity Assessment: Defer to PT evaluation LLE Deficits / Details: good ankle pump, fair quad set Cervical / Trunk Assessment Cervical / Trunk Assessment: Normal     Mobility Bed Mobility Bed Mobility: Not assessed Transfers Transfers: Sit to Stand;Stand to Sit Sit to Stand: 4: Min guard;4: Min assist;With upper extremity assist;From chair/3-in-1 Stand to Sit: 4: Min guard;With upper extremity assist;To chair/3-in-1 Details for Transfer Assistance: Min A for sit to stand from elevated commode. Cues for positioning of LLE.            End of Session OT - End of Session Equipment Utilized During Treatment: Gait belt;Rolling walker;Left knee immobilizer Activity Tolerance: Patient tolerated treatment well Patient left: in chair;with call bell/phone within reach;with family/visitor present  GO     Earlie Raveling OTR/L [phone] 04/15/2013, 12:18 PM

## 2013-04-15 NOTE — Evaluation (Signed)
Physical Therapy Evaluation Patient Details Name: Jon Lopez MRN: [id number] DOB: Jan 24, 1966 Today's Date: 04/15/2013 Time: 4098-1191 PT Time Calculation (min): 40 min  PT Assessment / Plan / Recommendation History of Present Illness  Pt. 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 underwent a left THA  revision  and presents to PT with anticipated post-op decrease in strength as well as decreased functional mobility and gait.  Pt. Will benefit from acute PT To address these and below issues.  Wife present and supportive.  Pt. Needed increased time especially with bed mobility this am due to his pain level.  He anticipates DC home tomorrow following therapies.  Will see in am.      PT Assessment  Patient needs continued PT services    Follow Up Recommendations  Home health PT;Supervision/Assistance - 24 hour;Supervision for mobility/OOB    Does the patient have the potential to tolerate intense rehabilitation      Barriers to Discharge        Equipment Recommendations  None recommended by PT;Other (comment) (as long as wife is able to locate pt's equipment in storage)    Recommendations for Other Services     Frequency 7X/week    Precautions / Restrictions Precautions Precautions: Posterior Hip Precaution Booklet Issued: Yes (comment) Precaution Comments: reviewed posterior hip precautions and provided educational handouts for exercises and precautions Required Braces or Orthoses: Knee Immobilizer - Left Knee Immobilizer - Left: Other (comment) (until discontinued) Restrictions Weight Bearing Restrictions: Yes LLE Weight Bearing: Weight bearing as tolerated   Pertinent Vitals/Pain See vitals tab Pain worse during bed mobility and decreased with walking activity.Pt. Left in recliner chair decreased pain compared to beginning of session.  RN going in to address pain issues.      Mobility  Bed Mobility Bed  Mobility: Supine to Sit;Sitting - Scoot to Edge of Bed Supine to Sit: 3: Mod assist;With rails;HOB elevated Sitting - Scoot to Edge of Bed: 3: Mod assist;With rail Details for Bed Mobility Assistance: Pt. moving slowly and cautiously; neded mod assist of L LE primarily as well as use of bed pad to move to EOB once sitting.  He needed increased time to complete bed mobility due to pain issues. Transfers Transfers: Sit to Stand;Stand to Sit Sit to Stand: 4: Min assist;From bed;With upper extremity assist Stand to Sit: 4: Min assist;To chair/3-in-1;With upper extremity assist;With armrests Details for Transfer Assistance: cues for hand and L LE  placement and safest technique; min steadying assist for transitions Ambulation/Gait Ambulation/Gait Assistance: 4: Min assist Ambulation Distance (Feet): 450 Feet Assistive device: Rolling walker Ambulation/Gait Assistance Details: min assist for safety and stability; cues for sequencing initially but then pt. following through with correct sequence Gait Pattern: Step-to pattern;Decreased hip/knee flexion - left Gait velocity: decreased Stairs: No    Exercises Total Joint Exercises Ankle Circles/Pumps: AROM;Both;10 reps;Supine Quad Sets: AROM;Both;10 reps;Supine   PT Diagnosis: Difficulty walking;Abnormality of gait;Acute pain  PT Problem List: Decreased strength;Decreased activity tolerance;Decreased mobility;Decreased knowledge of use of DME;Decreased knowledge of precautions;Pain PT Treatment Interventions: DME instruction;Gait training;Stair training;Functional mobility training;Therapeutic exercise;Patient/family education     PT Goals(Current goals can be found in the care plan section) Acute Rehab PT Goals Patient Stated Goal: return to independence in all activities PT Goal Formulation: With patient Time For Goal Achievement: 04/22/13 Potential to Achieve Goals: Good  Visit Information  Last PT Received On: 04/15/13 Assistance  Needed: +1 History  of Present Illness: Pt. 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, posterior approach       Prior Functioning  Home Living Family/patient expects to be discharged to:: Private residence Living Arrangements: Spouse/significant other;Children Available Help at Discharge: Available 24 hours/day Type of Home: Apartment Home Access: Stairs to enter [employer] of Steps: 1 Entrance Stairs-Rails: None Home Layout: One level Home Equipment: Environmental consultant - 2 wheels;Bedside commode Additional Comments: wife to see if she can locate equipment in the storage unit Prior Function Level of Independence: Independent Communication Communication: No difficulties    Cognition  Cognition Arousal/Alertness: Awake/alert Behavior During Therapy: WFL for tasks assessed/performed Overall Cognitive Status: Within Functional Limits for tasks assessed    Extremity/Trunk Assessment Upper Extremity Assessment Upper Extremity Assessment: Overall WFL for tasks assessed Lower Extremity Assessment Lower Extremity Assessment: LLE deficits/detail LLE Deficits / Details: good ankle pump, fair quad set Cervical / Trunk Assessment Cervical / Trunk Assessment: Normal   Balance Dynamic Standing Balance Dynamic Standing - Level of Assistance: 5: Stand by assistance  End of Session PT - End of Session Equipment Utilized During Treatment: Gait belt;Left knee immobilizer (per orders) Activity Tolerance: Patient tolerated treatment well Patient left: in chair;with call bell/phone within reach;with family/visitor present Nurse Communication: Mobility status;Patient requests pain meds;Precautions;Weight bearing status  GP     Ferman Hamming 04/15/2013, 10:28 AM Weldon Picking PT Acute Rehab Services [phone] [place] [phone]

## 2013-04-16 LAB — WOUND CULTURE
Culture: NO GROWTH
Gram Stain: NONE SEEN

## 2013-04-16 LAB — CBC
HCT: 32.2 % — ABNORMAL LOW (ref 39.0–52.0)
Hemoglobin: 10.9 g/dL — ABNORMAL LOW (ref 13.0–17.0)
MCH: 31.2 pg (ref 26.0–34.0)
MCHC: 33.9 g/dL (ref 30.0–36.0)
MCV: 92.3 fL (ref 78.0–100.0)
RDW: 15.8 % — ABNORMAL HIGH (ref 11.5–15.5)

## 2013-04-16 NOTE — Progress Notes (Signed)
Pt discharged to home. D/c instructions given. No questions verbalized. Vitals stable. 

## 2013-04-16 NOTE — Discharge Summary (Signed)
  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 Discharge to home in stable condition. Prescriptions for anticoagulation and pain medication and muscle relaxant medication provided. Call the office in one to 2 weeks.

## 2013-04-16 NOTE — Progress Notes (Addendum)
Occupational Therapy Treatment Patient Details Name: Jon Lopez MRN: [id number] DOB: 10/26/1965 Today's Date: 04/16/2013 Time: 4098-1191 OT Time Calculation (min): 18 min  OT Assessment / Plan / Recommendation  History of present illness Pt. 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 Education provided to pt and spouse. Feel pt is safe to d/c home, from OT standpoint, with family available to assist 24/7.  Follow Up Recommendations  No OT follow up;Supervision/Assistance - 24 hour    Barriers to Discharge       Equipment Recommendations  None recommended by OT    Recommendations for Other Services    Frequency Min 2X/week   Progress towards OT Goals Progress towards OT goals: Progressing toward goals  Plan Discharge plan remains appropriate    Precautions / Restrictions Precautions Precautions: Posterior Hip Precaution Booklet Issued: No Precaution Comments: Reviewed precautions with pt Required Braces or Orthoses: Knee Immobilizer -(can be off when up and mobilizing-spoke with Nurse who spoke with doctor) Restrictions Weight Bearing Restrictions: Yes LLE Weight Bearing: Weight bearing as tolerated   Pertinent Vitals/Pain Pain 5/10. Repositioned.     ADL  Toilet Transfer: Radiographer, therapeutic Method: Sit to Barista: Other (comment);Bedside commode (from chair and bed) Tub/Shower Transfer: Minimal assistance;Min guard Tub/Shower Transfer Method: Ambulating Tub/Shower Transfer Equipment: Other (comment) (practiced stepping over and back to 3 in 1) Equipment Used: Gait belt;Knee Immobilizer;Rolling walker;Reacher;Long-handled shoe horn;Long-handled sponge;Sock aid (placed KI on at end of session) Transfers/Ambulation Related to ADLs: Min guard for ambulation and supervision for sit <> stand. Min A/ Min guard for tub transfer. ADL Comments: OT reviewed AE for  LB ADLs and pt feels comfortable with this and did not want to practice. Practiced stepping over tub and it was painful for pt so OT educated on alternative techniques.     OT Diagnosis:    OT Problem List:   OT Treatment Interventions:     OT Goals(current goals can now be found in the care plan section) Acute Rehab OT Goals Patient Stated Goal: go home OT Goal Formulation: With patient Time For Goal Achievement: 04/22/13 Potential to Achieve Goals: Good ADL Goals Pt Will Perform Lower Body Bathing: with modified independence;with adaptive equipment;sit to/from stand Pt Will Perform Lower Body Dressing: with adaptive equipment;sit to/from stand;with modified independence Pt Will Transfer to Toilet: with modified independence;ambulating (3 in 1 over commode) Pt Will Perform Toileting - Clothing Manipulation and hygiene: with modified independence;sit to/from stand Pt Will Perform Tub/Shower Transfer: Tub transfer;with supervision;ambulating;rolling walker (tub equipment tbd) Additional ADL Goal #1: Pt will independently verbalize and demonstrate 3/3 hip precautions.  Additional ADL Goal #2: Pt will perform bed mobility at Mod I level as precursor for ADLs.  Visit Information  Last OT Received On: 04/16/13 Assistance Needed: +1 History of Present Illness: Pt. 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    Exercises      Balance     End of Session OT - End of Session Equipment Utilized During Treatment: Gait belt;Rolling walker;Left knee immobilizer (placed KI on at end of session) Activity Tolerance: Patient tolerated treatment well Patient left: in chair;with call bell/phone within reach;with family/visitor present  GO     Earlie Raveling OTR/L [phone] 04/16/2013, 12:37 PM

## 2013-04-16 NOTE — Progress Notes (Signed)
Physical Therapy Treatment Patient Details Name: Jon Lopez MRN: [id number] DOB: January 25, 1966 Today's Date: 04/16/2013 Time: 4098-1191 PT Time Calculation (min): 26 min  PT Assessment / Plan / Recommendation  History of Present Illness Pt. 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, posterior approach   PT Comments   Making progress with mobility; OK for dc home from PT standpoint  Follow Up Recommendations  Home health PT;Supervision/Assistance - 24 hour;Supervision for mobility/OOB     Does the patient have the potential to tolerate intense rehabilitation     Barriers to Discharge        Equipment Recommendations  None recommended by PT    Recommendations for Other Services    Frequency 7X/week   Progress towards PT Goals Progress towards PT goals: Progressing toward goals  Plan Current plan remains appropriate    Precautions / Restrictions Precautions Precautions: Posterior Hip Precaution Comments: Reviewed hip precautions Required Braces or Orthoses: Knee Immobilizer - Left Restrictions Weight Bearing Restrictions: Yes LLE Weight Bearing: Weight bearing as tolerated   Pertinent Vitals/Pain 5/10 L hip and it remained 5/10 throughout session patient repositioned for comfort     Mobility  Bed Mobility Bed Mobility: Supine to Sit;Sit to Supine Supine to Sit: 4: Min guard Sitting - Scoot to [place] of Bed: 4: Min guard Sit to Supine: 4: Min assist Details for Bed Mobility Assistance: Cues for technique; bed elevated to apporximate home; Min assist for getting LLE into bed Transfers Transfers: Sit to Stand;Stand to Sit Sit to Stand: 5: Supervision Stand to Sit: 5: Supervision Details for Transfer Assistance: Cues for technique Ambulation/Gait Ambulation/Gait Assistance: 5: Supervision Ambulation Distance (Feet): 250 Feet Assistive device: Rolling walker Ambulation/Gait Assistance Details: Cues for prec with turns; naturally emerging step-through  gait Gait velocity: decreased Stairs: Yes Stairs Assistance: 4: Min guard Stair Management Technique: No rails;Backwards;With walker Number of Stairs: 1    Exercises     PT Diagnosis:    PT Problem List:   PT Treatment Interventions:     PT Goals (current goals can now be found in the care plan section) Acute Rehab PT Goals Patient Stated Goal: get out of here PT Goal Formulation: With patient Time For Goal Achievement: 04/22/13 Potential to Achieve Goals: Good  Visit Information  Last PT Received On: 04/16/13 Assistance Needed: +1 History of Present Illness: Pt. 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, posterior approach    Subjective Data  Subjective: hoping to go home today Patient Stated Goal: get out of here   Cognition  Cognition Arousal/Alertness: Awake/alert Behavior During Therapy: WFL for tasks assessed/performed Overall Cognitive Status: Within Functional Limits for tasks assessed    Balance     End of Session PT - End of Session Equipment Utilized During Treatment: Gait belt;Left knee immobilizer (per orders) Activity Tolerance: Patient tolerated treatment well Patient left: with call bell/phone within reach;with family/visitor present;in bed Nurse Communication: Mobility status;Patient requests pain meds;Precautions;Weight bearing status   GP     Van Clines [facility] [place], [place] [phone]  04/16/2013, 10:38 AM

## 2013-04-16 NOTE — Progress Notes (Signed)
   CARE MANAGEMENT NOTE 04/16/2013  Patient:  Jon Lopez, Jon Lopez   Account Number:  [id number]  Date Initiated:  04/16/2013  Documentation initiated by:  [facility]  Subjective/Objective Assessment:   adm: w/Left Failed [place]w ASR total hip arthroplasty     Action/Plan:   discharge planning   Anticipated DC Date:  04/16/2013   Anticipated DC Plan:  HOME W HOME HEALTH SERVICES      DC Planning Services  CM consult      [facility] Choice  HOME HEALTH   Choice offered to / List presented to:  C-1 Patient        HH arranged  HH-2 PT      [facility] agency  [facility]   Status of service:  Completed, signed off Medicare Important Message given?   (If response is "NO", the following Medicare IM given date fields will be blank) Date Medicare IM given:   Date Additional Medicare IM given:    Discharge Disposition:  HOME W HOME HEALTH SERVICES  Per UR Regulation:    If discussed at Long Length of Stay Meetings, dates discussed:    Comments:  04/16/13 10:10 Cm met with pt in room for choice.  Pt and wife chose [place] as they had thenm for pt's first surgery but [facility] no longer services pt's area code.  Pt and spouse were notified as such and they now choose [facility] for HHPT.  Address and contact numbers were verified. Referral faxed to [facility].   No DME needed as pt and spouse state they have everything they need from the first surgery.  Freddy Jaksch, BSN, CM [phone]

## 2013-04-18 ENCOUNTER — Encounter (HOSPITAL_COMMUNITY): Payer: Self-pay | Admitting: Orthopaedic Surgery

## 2013-04-19 LAB — ANAEROBIC CULTURE

## 2013-07-24 ENCOUNTER — Other Ambulatory Visit (HOSPITAL_BASED_OUTPATIENT_CLINIC_OR_DEPARTMENT_OTHER): Payer: Self-pay | Admitting: Rheumatology

## 2013-07-24 DIAGNOSIS — M3130 Wegener's granulomatosis without renal involvement: Secondary | ICD-10-CM

## 2013-07-25 ENCOUNTER — Ambulatory Visit (HOSPITAL_BASED_OUTPATIENT_CLINIC_OR_DEPARTMENT_OTHER)
Admission: RE | Admit: 2013-07-25 | Discharge: 2013-07-25 | Disposition: A | Discharge status: Home / Self Care | Payer: BC Managed Care – PPO | Source: Ambulatory Visit | Attending: Rheumatology | Admitting: Rheumatology

## 2013-07-25 ENCOUNTER — Encounter (HOSPITAL_BASED_OUTPATIENT_CLINIC_OR_DEPARTMENT_OTHER): Payer: Self-pay

## 2013-07-25 ENCOUNTER — Other Ambulatory Visit (HOSPITAL_BASED_OUTPATIENT_CLINIC_OR_DEPARTMENT_OTHER): Payer: Self-pay | Admitting: Rheumatology

## 2013-07-25 DIAGNOSIS — R0989 Other specified symptoms and signs involving the circulatory and respiratory systems: Secondary | ICD-10-CM

## 2013-07-25 DIAGNOSIS — M3130 Wegener's granulomatosis without renal involvement: Secondary | ICD-10-CM

## 2013-07-25 MED ORDER — IOHEXOL 300 MG/ML  SOLN
80.0000 mL | Freq: Once | INTRAMUSCULAR | Status: AC | PRN
  Administered 2013-07-25: 80 mL via INTRAVENOUS

## 2016-08-13 ENCOUNTER — Emergency Department (HOSPITAL_COMMUNITY): Admission: EM | Admit: 2016-08-13 | Payer: Self-pay | Source: Home / Self Care

## 2017-12-29 ENCOUNTER — Encounter (HOSPITAL_COMMUNITY): Payer: Self-pay | Admitting: Emergency Medicine

## 2017-12-29 ENCOUNTER — Emergency Department (HOSPITAL_COMMUNITY): Payer: No Typology Code available for payment source

## 2017-12-29 ENCOUNTER — Other Ambulatory Visit: Payer: Self-pay

## 2017-12-29 ENCOUNTER — Emergency Department (HOSPITAL_COMMUNITY)
Admission: EM | Admit: 2017-12-29 | Discharge: 2017-12-29 | Disposition: A | Discharge status: Home / Self Care | Payer: No Typology Code available for payment source | Attending: Emergency Medicine | Admitting: Emergency Medicine

## 2017-12-29 DIAGNOSIS — S199XXA Unspecified injury of neck, initial encounter: Secondary | ICD-10-CM | POA: Diagnosis present

## 2017-12-29 DIAGNOSIS — Z96643 Presence of artificial hip joint, bilateral: Secondary | ICD-10-CM | POA: Insufficient documentation

## 2017-12-29 DIAGNOSIS — Y9389 Activity, other specified: Secondary | ICD-10-CM | POA: Insufficient documentation

## 2017-12-29 DIAGNOSIS — Z87891 Personal history of nicotine dependence: Secondary | ICD-10-CM | POA: Insufficient documentation

## 2017-12-29 DIAGNOSIS — Y999 Unspecified external cause status: Secondary | ICD-10-CM | POA: Insufficient documentation

## 2017-12-29 DIAGNOSIS — Y92410 Unspecified street and highway as the place of occurrence of the external cause: Secondary | ICD-10-CM | POA: Insufficient documentation

## 2017-12-29 DIAGNOSIS — Z7982 Long term (current) use of aspirin: Secondary | ICD-10-CM | POA: Diagnosis not present

## 2017-12-29 DIAGNOSIS — S39012A Strain of muscle, fascia and tendon of lower back, initial encounter: Secondary | ICD-10-CM

## 2017-12-29 DIAGNOSIS — Z79899 Other long term (current) drug therapy: Secondary | ICD-10-CM | POA: Diagnosis not present

## 2017-12-29 DIAGNOSIS — S161XXA Strain of muscle, fascia and tendon at neck level, initial encounter: Secondary | ICD-10-CM | POA: Insufficient documentation

## 2017-12-29 MED ORDER — CYCLOBENZAPRINE HCL 10 MG PO TABS: 10.0000 mg | ORAL_TABLET | Freq: Three times a day (TID) | ORAL | 0 refills | Status: AC | PRN

## 2017-12-29 MED ORDER — IBUPROFEN 800 MG PO TABS
800.0000 mg | ORAL_TABLET | Freq: Once | ORAL | Status: AC
  Administered 2017-12-29: 800 mg via ORAL
  Filled 2017-12-29: qty 1

## 2017-12-29 MED ORDER — IBUPROFEN 800 MG PO TABS: 800.0000 mg | ORAL_TABLET | Freq: Three times a day (TID) | ORAL | 0 refills | Status: AC
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
## 2017-12-29 NOTE — Discharge Instructions (Addendum)
Apply ice packs on and off to your neck and back.  Avoid strenuous activity for several days.  Follow-up with your primary doctor for recheck if needed.  Return to ER for any worsening symptoms.
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
## 2017-12-29 NOTE — ED Notes (Signed)
Pt with cervical collar on. Complaints of neck and lower back pain. Also reports HA

## 2017-12-29 NOTE — ED Notes (Signed)
Patient transported to X-ray 

## 2018-06-20 ENCOUNTER — Encounter (INDEPENDENT_AMBULATORY_CARE_PROVIDER_SITE_OTHER): Payer: Self-pay | Admitting: *Deleted

## 2019-01-13 ENCOUNTER — Other Ambulatory Visit: Payer: Self-pay | Admitting: Podiatry

## 2019-01-13 ENCOUNTER — Ambulatory Visit (INDEPENDENT_AMBULATORY_CARE_PROVIDER_SITE_OTHER): Payer: BC Managed Care – PPO

## 2019-01-13 ENCOUNTER — Other Ambulatory Visit: Payer: Self-pay

## 2019-01-13 ENCOUNTER — Encounter: Payer: Self-pay | Admitting: Podiatry

## 2019-01-13 ENCOUNTER — Ambulatory Visit (INDEPENDENT_AMBULATORY_CARE_PROVIDER_SITE_OTHER): Payer: BC Managed Care – PPO | Admitting: Podiatry

## 2019-01-13 DIAGNOSIS — M722 Plantar fascial fibromatosis: Secondary | ICD-10-CM

## 2019-01-16 NOTE — Progress Notes (Signed)
   Subjective: 53 y.o. male presenting today as a new patient with a chief complaint of burning, stabbing pain to the posterior heels and plantar feet that began about 4 months ago after starting a new job. He reports associated swelling. He states he is on his feet a lot throughout the day which increases the pain. He has not had any treatment for the symptoms. Patient is here for further evaluation and treatment.   Past Medical History:  Diagnosis Date  . Gout ~ 2009  . Liver cirrhosis, alcoholic ([place])    Hx: of  . Tuberculosis 2007     Objective: Physical Exam General: The patient is alert and oriented x3 in no acute distress.  Dermatology: Skin is warm, dry and supple bilateral lower extremities. Negative for open lesions or macerations bilateral.   Vascular: Dorsalis Pedis and Posterior Tibial pulses palpable bilateral.  Capillary fill time is immediate to all digits.  Neurological: Epicritic and protective threshold intact bilateral.   Musculoskeletal: Tenderness to palpation to the plantar aspect of the bilateral heels along the plantar fascia. Pain with palpation noted to the 5th MPJ of the right foot. All other joints range of motion within normal limits bilateral. Strength 5/5 in all groups bilateral.   Radiographic exam: Normal osseous mineralization. Joint spaces preserved. No fracture/dislocation/boney destruction. No other soft tissue abnormalities or radiopaque foreign bodies.   Assessment: 1. plantar fasciitis bilateral feet 2. 5th MPJ capsulitis right   Plan of Care:  1. Patient evaluated. Xrays reviewed.   2. Appointment with Liliane Channel, Pedorthist, for custom molded orthotics.  3. Instructed patient regarding therapies and modalities at home to alleviate symptoms.  4. Return to clinic as needed.  Works a new job at [employer].     Edrick Kins, DPM [facility]  Dr. Edrick Kins, DPM    [address], [place] [address]                Office [phone]  Fax [phone]

## 2019-01-26 ENCOUNTER — Ambulatory Visit (INDEPENDENT_AMBULATORY_CARE_PROVIDER_SITE_OTHER): Payer: BC Managed Care – PPO | Admitting: Orthotics

## 2019-01-26 ENCOUNTER — Other Ambulatory Visit: Payer: Self-pay

## 2019-01-26 DIAGNOSIS — M722 Plantar fascial fibromatosis: Secondary | ICD-10-CM

## 2019-01-26 NOTE — Progress Notes (Signed)

## 2019-02-23 ENCOUNTER — Other Ambulatory Visit: Payer: No Typology Code available for payment source | Admitting: Orthotics
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
## 2019-03-02 ENCOUNTER — Ambulatory Visit: Payer: BC Managed Care – PPO | Admitting: Orthotics

## 2019-03-02 ENCOUNTER — Other Ambulatory Visit: Payer: Self-pay

## 2019-03-02 DIAGNOSIS — M722 Plantar fascial fibromatosis: Secondary | ICD-10-CM

## 2019-03-02 NOTE — Progress Notes (Signed)
Patient came in today to pick up custom made foot orthotics.  The goals were accomplished and the patient reported no dissatisfaction with said orthotics.  Patient was advised of breakin period and how to report any issues. 

## 2019-04-28 ENCOUNTER — Telehealth: Payer: Self-pay | Admitting: Podiatry

## 2019-04-28 NOTE — Telephone Encounter (Signed)
pts wife left message on 12.16 checking on status of orthotics. She said its been almost 3 months since they were originally ordered and they are possibly not wanting them since it has taken this long. They were sent back for adjustment.  Upon looking they were shipped out to [place] on 12.11 and still have not arrived.[employer] has delayed it.  I left message that the issue is currently [employer] and they should have arrived 12.14 but have not yet. Per [place] [facility] they have not been delivered and it is in pending stating it maybe on truck for delivery today. Someone should call if they come in.
